# Patient Record
Sex: Male | Born: 1995
Health system: Southern US, Community
[De-identification: ages and names within clinical notes are randomized; demographics above are authoritative.]

---
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
## 2015-08-23 DIAGNOSIS — K080 Exfoliation of teeth due to systemic causes: Secondary | ICD-10-CM | POA: Diagnosis not present

## 2015-11-19 DIAGNOSIS — Z0000 Encounter for general adult medical examination without abnormal findings: Secondary | ICD-10-CM | POA: Diagnosis not present

## 2016-03-07 DIAGNOSIS — R0789 Other chest pain: Secondary | ICD-10-CM | POA: Diagnosis not present

## 2016-03-07 DIAGNOSIS — M542 Cervicalgia: Secondary | ICD-10-CM | POA: Diagnosis not present

## 2016-03-08 ENCOUNTER — Ambulatory Visit
Admission: RE | Admit: 2016-03-08 | Discharge: 2016-03-08 | Disposition: A | Discharge status: Home / Self Care | Payer: Federal, State, Local not specified - PPO | Source: Ambulatory Visit | Attending: Nurse Practitioner | Admitting: Nurse Practitioner

## 2016-03-08 ENCOUNTER — Other Ambulatory Visit: Payer: Self-pay | Admitting: Nurse Practitioner

## 2016-03-08 DIAGNOSIS — M542 Cervicalgia: Secondary | ICD-10-CM

## 2016-03-08 DIAGNOSIS — R0789 Other chest pain: Secondary | ICD-10-CM

## 2016-03-08 DIAGNOSIS — R079 Chest pain, unspecified: Secondary | ICD-10-CM | POA: Diagnosis not present

## 2016-06-05 DIAGNOSIS — K080 Exfoliation of teeth due to systemic causes: Secondary | ICD-10-CM | POA: Diagnosis not present

## 2017-04-19 DIAGNOSIS — R42 Dizziness and giddiness: Secondary | ICD-10-CM | POA: Diagnosis not present

## 2017-04-19 DIAGNOSIS — R300 Dysuria: Secondary | ICD-10-CM | POA: Diagnosis not present

## 2017-04-19 DIAGNOSIS — R369 Urethral discharge, unspecified: Secondary | ICD-10-CM | POA: Diagnosis not present

## 2017-10-03 DIAGNOSIS — N342 Other urethritis: Secondary | ICD-10-CM | POA: Diagnosis not present

## 2017-10-03 DIAGNOSIS — F17210 Nicotine dependence, cigarettes, uncomplicated: Secondary | ICD-10-CM | POA: Diagnosis not present

## 2017-10-03 DIAGNOSIS — R0600 Dyspnea, unspecified: Secondary | ICD-10-CM | POA: Diagnosis not present
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
## 2018-11-21 DIAGNOSIS — Z113 Encounter for screening for infections with a predominantly sexual mode of transmission: Secondary | ICD-10-CM | POA: Diagnosis not present

## 2018-11-23 DIAGNOSIS — R350 Frequency of micturition: Secondary | ICD-10-CM | POA: Diagnosis not present

## 2018-11-23 DIAGNOSIS — R3915 Urgency of urination: Secondary | ICD-10-CM | POA: Diagnosis not present

## 2018-11-24 DIAGNOSIS — R350 Frequency of micturition: Secondary | ICD-10-CM | POA: Diagnosis not present

## 2018-11-24 DIAGNOSIS — R3915 Urgency of urination: Secondary | ICD-10-CM | POA: Diagnosis not present

## 2019-04-01 ENCOUNTER — Other Ambulatory Visit: Payer: Self-pay

## 2019-04-01 ENCOUNTER — Ambulatory Visit (HOSPITAL_COMMUNITY)
Admission: EM | Admit: 2019-04-01 | Discharge: 2019-04-01 | Disposition: A | Discharge status: Home / Self Care | Payer: Federal, State, Local not specified - PPO | Attending: Physician Assistant | Admitting: Physician Assistant

## 2019-04-01 ENCOUNTER — Encounter (HOSPITAL_COMMUNITY): Payer: Self-pay

## 2019-04-01 DIAGNOSIS — R369 Urethral discharge, unspecified: Secondary | ICD-10-CM | POA: Insufficient documentation

## 2019-04-01 LAB — POCT URINALYSIS DIP (DEVICE)
Bilirubin Urine: NEGATIVE
Glucose, UA: NEGATIVE mg/dL
Hgb urine dipstick: NEGATIVE
Ketones, ur: NEGATIVE mg/dL
Nitrite: NEGATIVE
Protein, ur: 30 mg/dL — AB
Specific Gravity, Urine: >=1.03 (ref 1.005–1.030)
Urobilinogen, UA: 0.2 mg/dL (ref 0.0–1.0)
pH: 7 (ref 5.0–8.0)

## 2019-04-01 LAB — HIV ANTIBODY (ROUTINE TESTING W REFLEX): HIV Screen 4th Generation wRfx: NONREACTIVE

## 2019-04-01 NOTE — Discharge Instructions (Signed)
We have sent your lab work out and will notify you of any results that require treatment.  Please follow up with your primary care, in the event we do not find a treatable cause of your discharge.  If you develop painful urination, change in the discharge to be green/yellow, please follow up with this clinic or primary care.

## 2019-04-01 NOTE — ED Provider Notes (Signed)
[place]    CSN: [id number] Arrival date & time: 04/01/19  1813      History   Chief Complaint Chief Complaint  Patient presents with  . Exposure to STD    HPI Joe Gross is a 24 y.o. male.   Patient reports to urgent care today for 1 week of clear penile discharge after urination. He denies painful urination. He does endorse some frequency but denies urgency. The discharge only occurs after urination and is always clear. He reports a similar episode previously that was thought to be chlamydia, however he tested negative. He notes at the time his provider told him "I had protein in my urine".   He is sexually active with one partner and they no longer utilize condoms, as this is a long term relationship.      History reviewed. No pertinent past medical history.  There are no problems to display for this patient.   History reviewed. No pertinent surgical history.     Home Medications    Prior to Admission medications   Medication Sig Start Date End Date Taking? Authorizing Provider  Multiple Vitamin (MULTIVITAMIN WITH MINERALS) TABS Take 1 tablet by mouth daily.    [provider]    Family History Family History  Problem Relation Age of Onset  . Healthy Mother     Social History Social History   Tobacco Use  . Smoking status: Current Every Day Smoker  Substance Use Topics  . Alcohol use: Not on file  . Drug use: Yes    Types: Marijuana     Allergies   Patient has no known allergies.   Review of Systems Review of Systems  Constitutional: Negative for chills and fever.  Genitourinary: Positive for discharge and frequency. Negative for dysuria, flank pain, penile pain, penile swelling, scrotal swelling, testicular pain and urgency.  All other systems reviewed and are negative.    Physical Exam Triage Vital Signs ED Triage Vitals  Enc Vitals Group     BP 04/01/19 1838 112/77     Pulse Rate 04/01/19 1838 69     Resp  04/01/19 1838 16     Temp 04/01/19 1838 98.1 F (36.7 C)     Temp Source 04/01/19 1838 Oral     SpO2 04/01/19 1838 100 %     Weight --      Height --      Head Circumference --      Peak Flow --      Pain Score 04/01/19 1850 0     Pain Loc --      Pain Edu? --      Excl. in [place]? --    No data found.  Updated Vital Signs BP 112/77 (BP Location: Right Arm)   Pulse 69   Temp 98.1 F (36.7 C) (Oral)   Resp 16   SpO2 100%   Visual Acuity Right Eye Distance:   Left Eye Distance:   Bilateral Distance:    Right Eye Near:   Left Eye Near:    Bilateral Near:     Physical Exam Vitals and nursing note reviewed.  Constitutional:      General: He is not in acute distress.    Appearance: He is well-developed and normal weight. He is not ill-appearing.  HENT:     Head: Normocephalic and atraumatic.  Eyes:     General: No scleral icterus.    Extraocular Movements: Extraocular movements intact.     Conjunctiva/sclera:  Conjunctivae normal.     Pupils: Pupils are equal, round, and reactive to light.  Cardiovascular:     Rate and Rhythm: Normal rate.  Pulmonary:     Effort: Pulmonary effort is normal. No respiratory distress.  Genitourinary:    Penis: Normal.      Testes: Normal.  Musculoskeletal:     Right lower leg: No edema.     Left lower leg: No edema.  Skin:    General: Skin is warm and dry.     Findings: No rash.  Neurological:     General: No focal deficit present.     Mental Status: He is alert and oriented to person, place, and time.  Psychiatric:        Mood and Affect: Mood normal.        Behavior: Behavior normal.        Thought Content: Thought content normal.        Judgment: Judgment normal.      UC Treatments / Results  Labs (all labs ordered are listed, but only abnormal results are displayed) Labs Reviewed  POCT URINALYSIS DIP (DEVICE) - Abnormal; Notable for the following components:      Result Value   Protein, ur 30 (*)    Leukocytes,Ua  TRACE (*)    All other components within normal limits  URINE CULTURE  HIV ANTIBODY (ROUTINE TESTING W REFLEX)  RPR  CYTOLOGY, (ORAL, ANAL, URETHRAL) ANCILLARY ONLY    EKG   Radiology No results found.  Procedures Procedures (including critical care time)  Medications Ordered in UC Medications - No data to display  Initial Impression / Assessment and Plan / UC Course  I have reviewed the triage vital signs and the nursing notes.  Pertinent labs & imaging results that were available during my care of the patient were reviewed by me and considered in my medical decision making (see chart for details).     #Penile Discharge - UA with trace leuks and protein. Symptoms not consistent with infectious etiology. Culture sent, swab for GC/CT/Trich sent. HIV and RPR sent. Discussed pending results of tests, that he should follow up with this primary care to discuss further evaluation of the protein in his urine.   Final Clinical Impressions(s) / UC Diagnoses   Final diagnoses:  Penile discharge     Discharge Instructions     We have sent your lab work out and will notify you of any results that require treatment.  Please follow up with your primary care, in the event we do not find a treatable cause of your discharge.  If you develop painful urination, change in the discharge to be green/yellow, please follow up with this clinic or primary care.     ED Prescriptions    None     PDMP not reviewed this encounter.   Hermelinda Medicus, PA-C 04/01/19 2251

## 2019-04-01 NOTE — ED Triage Notes (Signed)
Pt presents for STD testing after having penile discharge and frequent urination X 1 week .

## 2019-04-02 LAB — URINE CULTURE: Culture: NO GROWTH

## 2019-04-02 LAB — RPR: RPR Ser Ql: NONREACTIVE

## 2019-04-03 LAB — CYTOLOGY, (ORAL, ANAL, URETHRAL) ANCILLARY ONLY
Chlamydia: NEGATIVE
Neisseria Gonorrhea: NEGATIVE
Trichomonas: NEGATIVE

## 2019-05-19 DIAGNOSIS — R630 Anorexia: Secondary | ICD-10-CM | POA: Diagnosis not present

## 2019-05-19 DIAGNOSIS — R369 Urethral discharge, unspecified: Secondary | ICD-10-CM | POA: Diagnosis not present

## 2019-05-19 DIAGNOSIS — Z23 Encounter for immunization: Secondary | ICD-10-CM | POA: Diagnosis not present

## 2019-06-23 DIAGNOSIS — D72819 Decreased white blood cell count, unspecified: Secondary | ICD-10-CM | POA: Diagnosis not present

## 2019-09-15 ENCOUNTER — Ambulatory Visit
Admission: RE | Admit: 2019-09-15 | Discharge: 2019-09-15 | Disposition: A | Discharge status: Home / Self Care | Payer: Federal, State, Local not specified - PPO | Source: Ambulatory Visit | Attending: Internal Medicine | Admitting: Internal Medicine

## 2019-09-15 ENCOUNTER — Other Ambulatory Visit: Payer: Self-pay | Admitting: Internal Medicine

## 2019-09-15 DIAGNOSIS — M79642 Pain in left hand: Secondary | ICD-10-CM

## 2019-09-15 DIAGNOSIS — M79643 Pain in unspecified hand: Secondary | ICD-10-CM | POA: Diagnosis not present

## 2019-09-15 DIAGNOSIS — R300 Dysuria: Secondary | ICD-10-CM | POA: Diagnosis not present

## 2019-09-15 DIAGNOSIS — R195 Other fecal abnormalities: Secondary | ICD-10-CM | POA: Diagnosis not present

## 2020-02-29 DIAGNOSIS — Z03818 Encounter for observation for suspected exposure to other biological agents ruled out: Secondary | ICD-10-CM | POA: Diagnosis not present

## 2020-06-24 DIAGNOSIS — S60212A Contusion of left wrist, initial encounter: Secondary | ICD-10-CM | POA: Diagnosis not present

## 2021-01-19 DIAGNOSIS — Z13220 Encounter for screening for lipoid disorders: Secondary | ICD-10-CM | POA: Diagnosis not present

## 2021-01-19 DIAGNOSIS — Z0000 Encounter for general adult medical examination without abnormal findings: Secondary | ICD-10-CM | POA: Diagnosis not present

## 2021-01-19 DIAGNOSIS — Z114 Encounter for screening for human immunodeficiency virus [HIV]: Secondary | ICD-10-CM | POA: Diagnosis not present

## 2021-01-19 DIAGNOSIS — Z113 Encounter for screening for infections with a predominantly sexual mode of transmission: Secondary | ICD-10-CM | POA: Diagnosis not present

## 2021-01-19 DIAGNOSIS — Z1329 Encounter for screening for other suspected endocrine disorder: Secondary | ICD-10-CM | POA: Diagnosis not present

## 2021-11-12 IMAGING — DX DG HAND COMPLETE 3+V*L*
3 series · 3 of 3 positions shown · non-contrast
Comparison: None.

CLINICAL DATA: Left hand pain following blunt trauma, initial
encounter

EXAM:
LEFT HAND - COMPLETE 3+ VIEW

[dg hand complete left (1 of 3)]
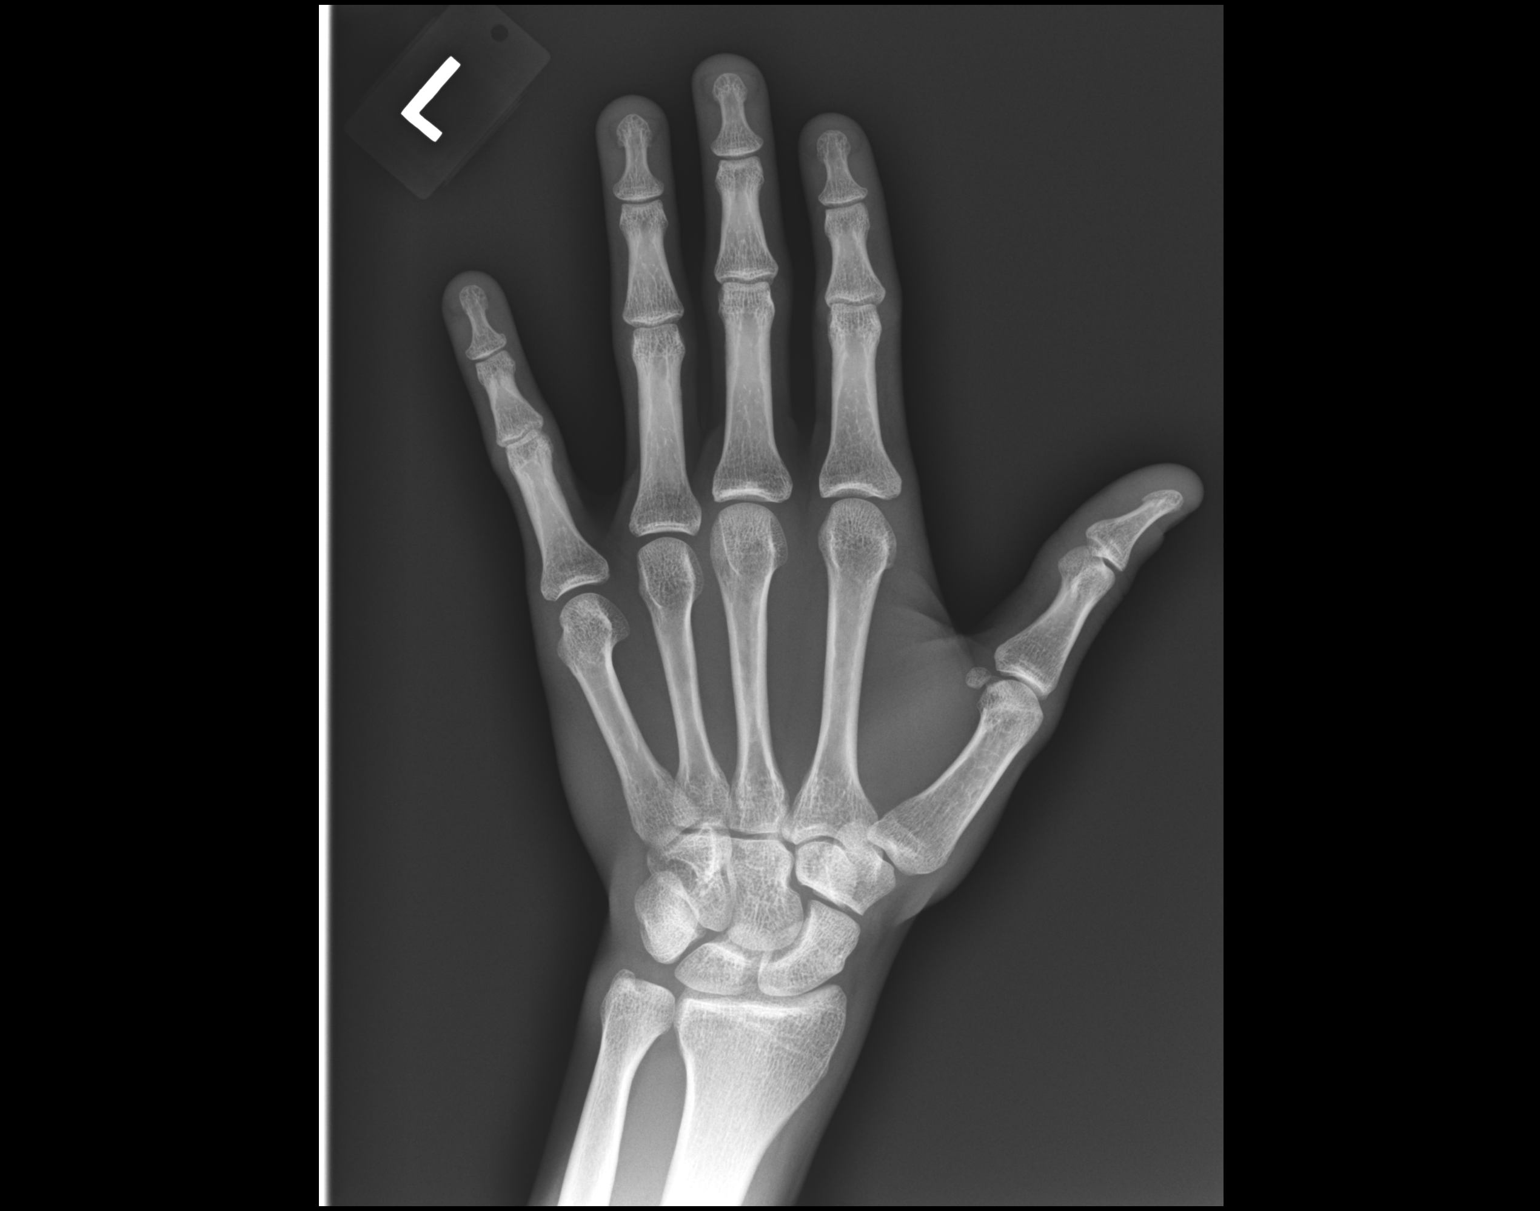

[dg hand complete left (2 of 3)]
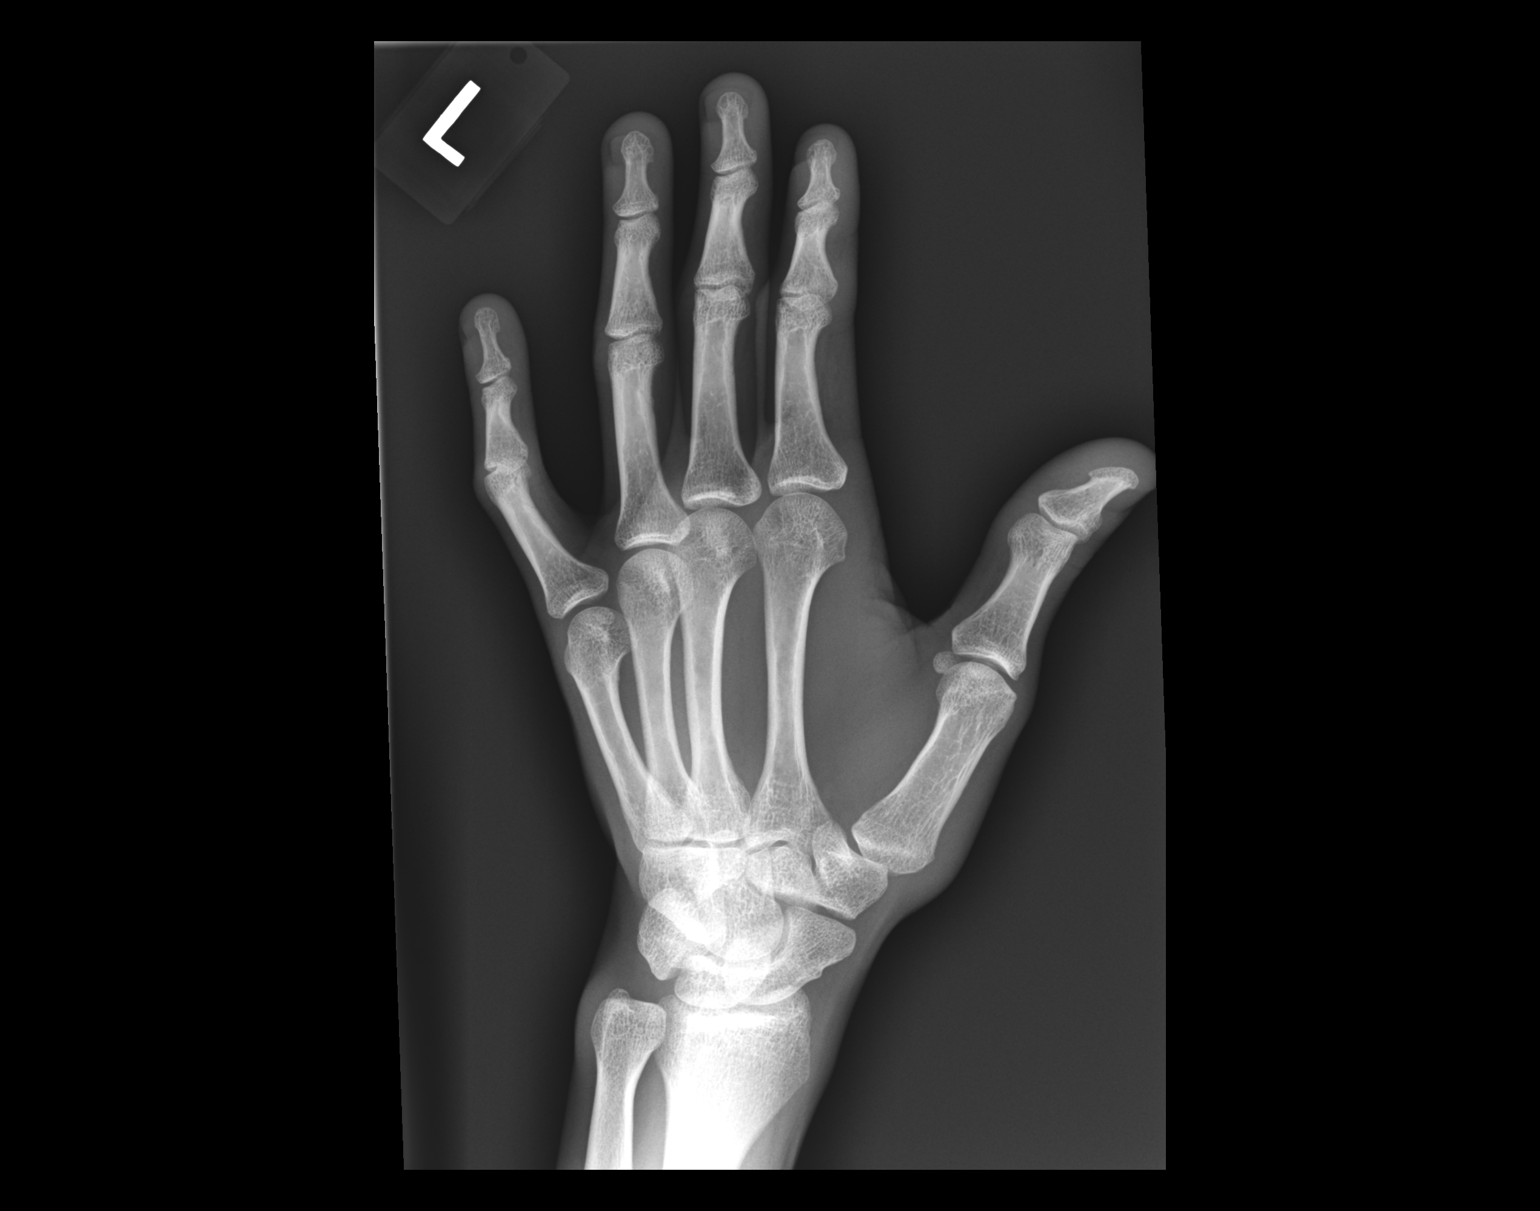

[dg hand complete left (3 of 3)]
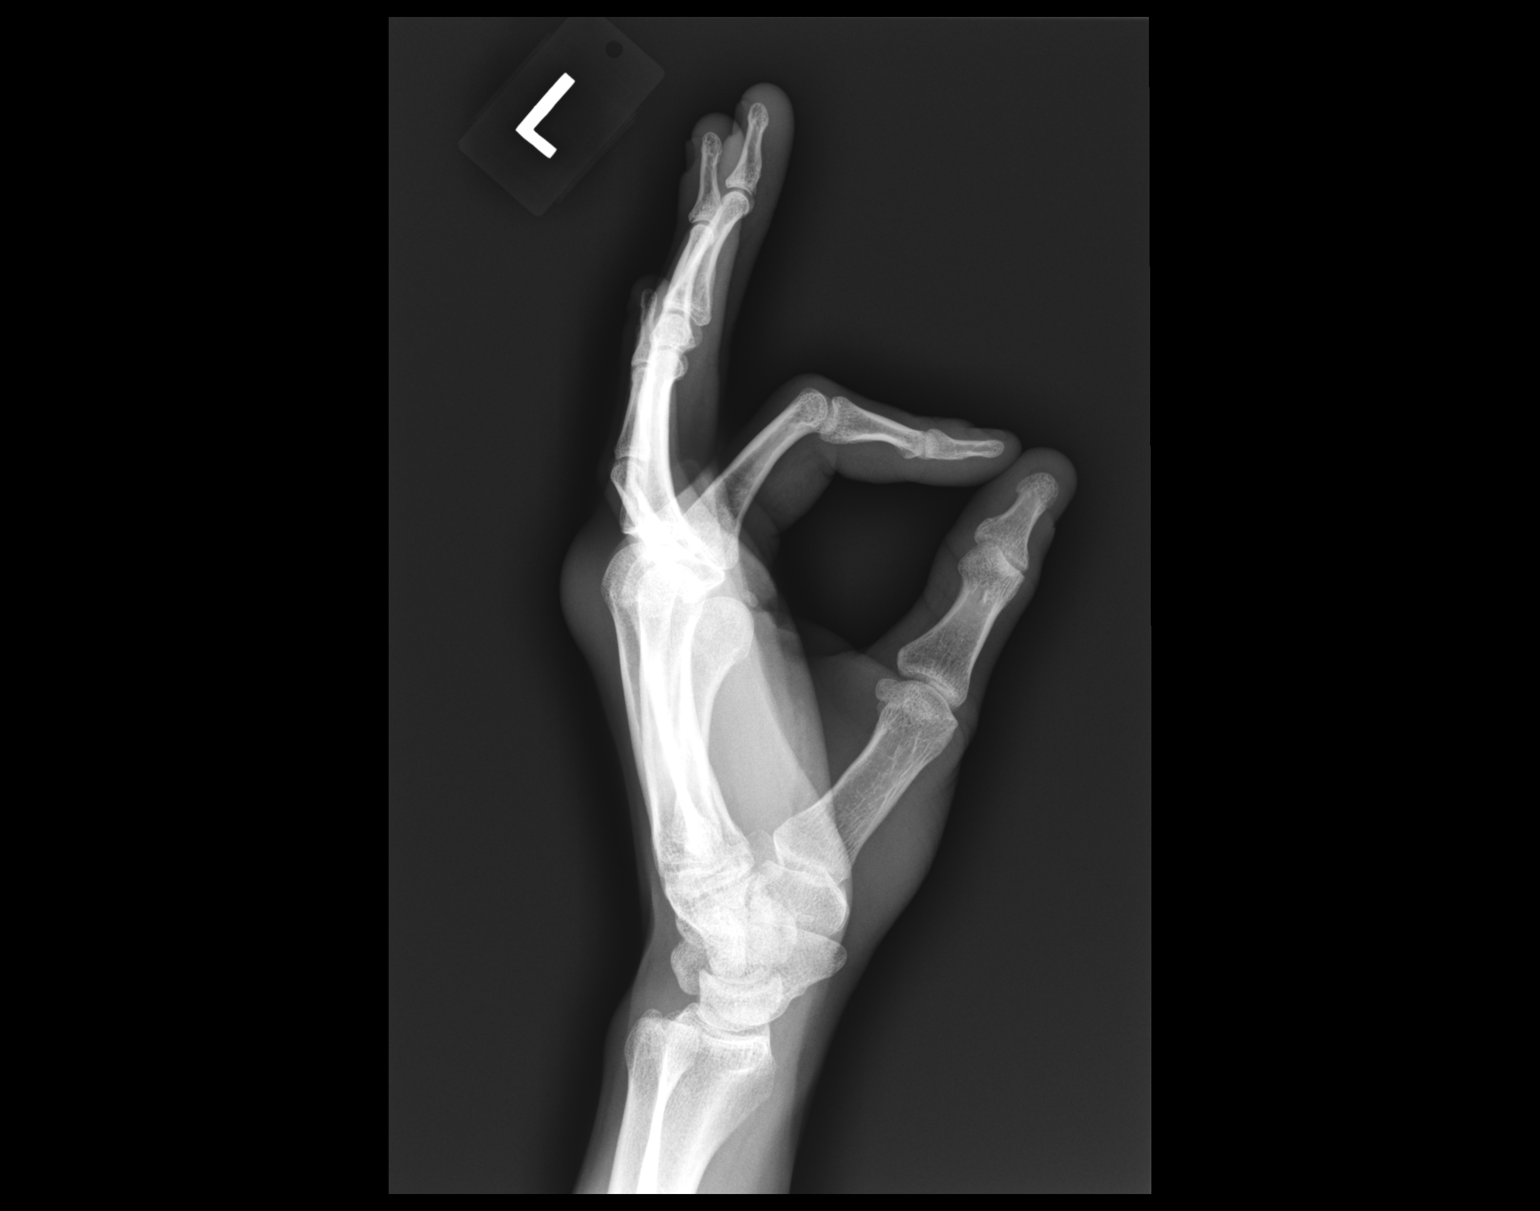

[3 of 3 positions shown; findings below may reference images not displayed]

FINDINGS: There is no evidence of fracture or dislocation. There is no
evidence of arthropathy or other focal bone abnormality. Soft
tissues are unremarkable.
IMPRESSION: No acute abnormality seen.
